# Patient Record
Sex: Female | Born: 1985 | Race: White | Hispanic: No | Marital: Single | Smoking: Never smoker
Health system: Southern US, Community
[De-identification: ages and names within clinical notes are randomized; demographics above are authoritative.]

---

## 2017-12-05 ENCOUNTER — Emergency Department (HOSPITAL_COMMUNITY): Payer: Self-pay

## 2017-12-05 ENCOUNTER — Emergency Department (HOSPITAL_COMMUNITY)
Admission: EM | Admit: 2017-12-05 | Discharge: 2017-12-05 | Disposition: A | Discharge status: Home / Self Care | Payer: Self-pay | Attending: Emergency Medicine | Admitting: Emergency Medicine

## 2017-12-05 ENCOUNTER — Encounter (HOSPITAL_COMMUNITY): Payer: Self-pay | Admitting: Emergency Medicine

## 2017-12-05 ENCOUNTER — Other Ambulatory Visit: Payer: Self-pay

## 2017-12-05 DIAGNOSIS — D509 Iron deficiency anemia, unspecified: Secondary | ICD-10-CM | POA: Insufficient documentation

## 2017-12-05 DIAGNOSIS — N10 Acute tubulo-interstitial nephritis: Secondary | ICD-10-CM | POA: Insufficient documentation

## 2017-12-05 DIAGNOSIS — N83202 Unspecified ovarian cyst, left side: Secondary | ICD-10-CM | POA: Insufficient documentation

## 2017-12-05 DIAGNOSIS — E876 Hypokalemia: Secondary | ICD-10-CM | POA: Insufficient documentation

## 2017-12-05 DIAGNOSIS — R1032 Left lower quadrant pain: Secondary | ICD-10-CM | POA: Insufficient documentation

## 2017-12-05 DIAGNOSIS — N12 Tubulo-interstitial nephritis, not specified as acute or chronic: Secondary | ICD-10-CM

## 2017-12-05 LAB — CBC WITH DIFFERENTIAL/PLATELET
BASOS ABS: 0 10*3/uL (ref 0.0–0.1)
Basophils Relative: 0 %
Eosinophils Absolute: 0 10*3/uL (ref 0.0–0.7)
Eosinophils Relative: 0 %
HEMATOCRIT: 31.1 % — AB (ref 36.0–46.0)
Hemoglobin: 8.9 g/dL — ABNORMAL LOW (ref 12.0–15.0)
LYMPHS ABS: 1.2 10*3/uL (ref 0.7–4.0)
Lymphocytes Relative: 11 %
MCH: 18.4 pg — ABNORMAL LOW (ref 26.0–34.0)
MCHC: 28.6 g/dL — ABNORMAL LOW (ref 30.0–36.0)
MCV: 64.1 fL — AB (ref 78.0–100.0)
MONO ABS: 0.6 10*3/uL (ref 0.1–1.0)
MONOS PCT: 5 %
NEUTROS ABS: 9.4 10*3/uL — AB (ref 1.7–7.7)
NEUTROS PCT: 84 %
PLATELETS: 385 10*3/uL (ref 150–400)
RBC: 4.85 MIL/uL (ref 3.87–5.11)
RDW: 18.4 % — AB (ref 11.5–15.5)
WBC: 11.2 10*3/uL — AB (ref 4.0–10.5)

## 2017-12-05 LAB — BASIC METABOLIC PANEL
ANION GAP: 14 (ref 5–15)
BUN: 11 mg/dL (ref 6–20)
CALCIUM: 9.9 mg/dL (ref 8.9–10.3)
CO2: 21 mmol/L — ABNORMAL LOW (ref 22–32)
CREATININE: 0.94 mg/dL (ref 0.44–1.00)
Chloride: 99 mmol/L — ABNORMAL LOW (ref 101–111)
GFR calc non Af Amer: >60 mL/min (ref >60)
Glucose, Bld: 129 mg/dL — ABNORMAL HIGH (ref 65–99)
Potassium: 3.2 mmol/L — ABNORMAL LOW (ref 3.5–5.1)
SODIUM: 134 mmol/L — AB (ref 135–145)

## 2017-12-05 LAB — URINALYSIS, ROUTINE W REFLEX MICROSCOPIC
Bilirubin Urine: NEGATIVE
GLUCOSE, UA: NEGATIVE mg/dL
Hgb urine dipstick: NEGATIVE
Ketones, ur: 20 mg/dL — AB
LEUKOCYTES UA: NEGATIVE
Nitrite: NEGATIVE
PROTEIN: NEGATIVE mg/dL
SPECIFIC GRAVITY, URINE: 1.013 (ref 1.005–1.030)
pH: 6 (ref 5.0–8.0)

## 2017-12-05 LAB — I-STAT BETA HCG BLOOD, ED (MC, WL, AP ONLY)

## 2017-12-05 MED ORDER — ONDANSETRON HCL 4 MG/2ML IJ SOLN
4.0000 mg | Freq: Once | INTRAMUSCULAR | Status: AC
Start: 2017-12-05 — End: 2017-12-05
  Administered 2017-12-05: 4 mg via INTRAVENOUS
  Filled 2017-12-05: qty 2

## 2017-12-05 MED ORDER — KETOROLAC TROMETHAMINE 30 MG/ML IJ SOLN
30.0000 mg | Freq: Once | INTRAMUSCULAR | Status: AC
  Administered 2017-12-05: 30 mg via INTRAVENOUS
  Filled 2017-12-05: qty 1

## 2017-12-05 MED ORDER — FERROUS SULFATE 325 (65 FE) MG PO TABS: 325.0000 mg | ORAL_TABLET | Freq: Two times a day (BID) | ORAL | 0 refills | Status: AC

## 2017-12-05 MED ORDER — OXYCODONE-ACETAMINOPHEN 5-325 MG PO TABS: 1.0000 | ORAL_TABLET | ORAL | 0 refills | Status: AC | PRN

## 2017-12-05 MED ORDER — SODIUM CHLORIDE 0.9 % IV BOLUS
1000.0000 mL | Freq: Once | INTRAVENOUS | Status: AC
  Administered 2017-12-05: 1000 mL via INTRAVENOUS

## 2017-12-05 MED ORDER — ONDANSETRON HCL 4 MG PO TABS: 4.0000 mg | ORAL_TABLET | Freq: Four times a day (QID) | ORAL | 0 refills | Status: AC | PRN

## 2017-12-05 MED ORDER — MORPHINE SULFATE (PF) 4 MG/ML IV SOLN
4.0000 mg | Freq: Once | INTRAVENOUS | Status: AC
  Administered 2017-12-05: 4 mg via INTRAVENOUS
  Filled 2017-12-05: qty 1

## 2017-12-05 MED ORDER — POTASSIUM CHLORIDE CRYS ER 20 MEQ PO TBCR
40.0000 meq | EXTENDED_RELEASE_TABLET | Freq: Once | ORAL | Status: AC
Start: 2017-12-05 — End: 2017-12-05
  Administered 2017-12-05: 40 meq via ORAL
  Filled 2017-12-05: qty 2

## 2017-12-05 NOTE — ED Provider Notes (Signed)
[facility]-EMERGENCY DEPT Provider Note   CSN: [id number] Arrival date & time: 12/05/17  0043     History   Chief Complaint Chief Complaint  Patient presents with  . Flank Pain    HPI Dana Roberson is a 32 y.o. female.  The history is provided by the patient.  She has no significant past history, and comes in complaining of left flank pain which started this morning and got worse this afternoon and evening.  Pain is crampy and she rates it at 10/10.  Pain does radiate to the left lower abdomen.  She is unable to find any comfortable position.  Nothing makes it better, nothing makes it worse.  There is associated nausea and vomiting.  She was not aware of any fever, but has had some chills and sweats.  She has had some urinary hesitancy, but no dysuria.  She does have a history of having had a UTI about 4 years ago.  She has not taken anything for pain.  No prior history of kidney stones.  History reviewed. No pertinent past medical history.  There are no active problems to display for this patient.   History reviewed. No pertinent surgical history.   OB History   None      Home Medications    Prior to Admission medications   Not on File    Family History History reviewed. No pertinent family history.  Social History Social History   Tobacco Use  . Smoking status: Never Smoker  . Smokeless tobacco: Never Used  Substance Use Topics  . Alcohol use: Yes  . Drug use: Never     Allergies   Patient has no known allergies.   Review of Systems Review of Systems  All other systems reviewed and are negative.    Physical Exam Updated Vital Signs BP (!) 145/79 (BP Location: Left Arm)   Pulse 83   Temp 97.9 F (36.6 C) (Oral)   Resp 18   Ht  (1.6 m)   Wt 65.8 kg (145 lb)   LMP 11/05/2017   SpO2 100%   BMI 25.69 kg/m   Physical Exam  Nursing note and vitals reviewed.  32 year old female, in obvious pain, but in no acute  distress. Vital signs are significant for elevated systolic blood pressure. Oxygen saturation is 100%, which is normal. Head is normocephalic and atraumatic. PERRLA, EOMI. Oropharynx is clear. Neck is nontender and supple without adenopathy or JVD. Back is nontender in the midline.  There is moderate left CVA tenderness. Lungs are clear without rales, wheezes, or rhonchi. Chest is nontender. Heart has regular rate and rhythm without murmur. Abdomen is soft, flat, nontender without masses or hepatosplenomegaly and peristalsis is hypoactive. Extremities have no cyanosis or edema, full range of motion is present. Skin is warm and dry without rash. Neurologic: Mental status is normal, cranial nerves are intact, there are no motor or sensory deficits.  ED Treatments / Results  Labs (all labs ordered are listed, but only abnormal results are displayed) Labs Reviewed  URINALYSIS, ROUTINE W REFLEX MICROSCOPIC - Abnormal; Notable for the following components:      Result Value   Ketones, ur 20 (*)    All other components within normal limits  BASIC METABOLIC PANEL - Abnormal; Notable for the following components:   Sodium 134 (*)    Potassium 3.2 (*)    Chloride 99 (*)    CO2 21 (*)    [id number], Bld 129 (*)  All other components within normal limits  CBC WITH DIFFERENTIAL/PLATELET - Abnormal; Notable for the following components:   WBC 11.2 (*)    Hemoglobin 8.9 (*)    HCT 31.1 (*)    MCV 64.1 (*)    MCH 18.4 (*)    MCHC 28.6 (*)    RDW 18.4 (*)    Neutro Abs 9.4 (*)    All other components within normal limits  POC URINE PREG, ED  I-STAT BETA HCG BLOOD, ED ([facility], [facility], [facility] ONLY)   Radiology Ct Renal Stone Study  Result Date: 12/05/2017 CLINICAL DATA:  Left-sided flank pain starting yesterday. Nausea and vomiting. EXAM: CT ABDOMEN AND PELVIS WITHOUT CONTRAST TECHNIQUE: Multidetector CT imaging of the abdomen and pelvis was performed following the standard protocol without IV contrast.  COMPARISON:  None. FINDINGS: Lower chest: Lung bases are clear. Hepatobiliary: Multiple low-attenuation lesions in the liver. Largest measures about 11 mm diameter. Lesions are too small to accurately characterize on noncontrast imaging but likely represent cysts. Gallbladder and bile ducts are unremarkable. Pancreas: Unremarkable. No pancreatic ductal dilatation or surrounding inflammatory changes. Spleen: Normal in size without focal abnormality. Adrenals/Urinary Tract: Adrenal glands are unremarkable. Kidneys are normal, without renal calculi, focal lesion, or hydronephrosis. Bladder is unremarkable. Stomach/Bowel: Stomach is within normal limits. Appendix is not identified. No evidence of bowel wall thickening, distention, or inflammatory changes. Vascular/Lymphatic: No significant vascular findings are present. No enlarged abdominal or pelvic lymph nodes. Reproductive: Uterus is anteverted without enlargement. Right ovary is not enlarged. Left ovary is enlarged, measuring 3.6 x 6.3 cm. No focal mass lesion identified. Small amount of free fluid in the pelvis is likely physiologic. Other: No free air in the abdomen. Abdominal wall musculature appears intact. Musculoskeletal: No acute or significant osseous findings. IMPRESSION: 1. No renal or ureteral stone or obstruction. 2. Enlarged left ovary. Ultrasound suggested to exclude ovarian mass or torsion. 3. Small amount of free fluid in the pelvis is likely physiologic. 4. Multiple small hepatic low-attenuation lesions. Too small to characterize but likely to be cyst. Electronically Signed   By: Burman Nieves M.D.   On: 12/05/2017 03:53   US Pelvic Complete W Transvaginal And Torsion R/o  Result Date: 12/05/2017 CLINICAL DATA:  Left lower quadrant pain. Enlarged left ovary on CT. EXAM: TRANSABDOMINAL AND TRANSVAGINAL ULTRASOUND OF PELVIS DOPPLER ULTRASOUND OF OVARIES TECHNIQUE: Both transabdominal and transvaginal ultrasound examinations of the pelvis were  performed. Transabdominal technique was performed for global imaging of the pelvis including uterus, ovaries, adnexal regions, and pelvic cul-de-sac. It was necessary to proceed with endovaginal exam following the transabdominal exam to visualize the ovaries and endometrium. Color and duplex Doppler ultrasound was utilized to evaluate blood flow to the ovaries. COMPARISON:  CT abdomen and pelvis 12/05/2017 FINDINGS: Uterus Measurements: 8.2 x 4.1 x 5.1 cm. Uterus is anteverted. No fibroids or other mass visualized. Nabothian cysts in the cervix. Endometrium Thickness: 12.4 mm.  No focal abnormality visualized. Right ovary Measurements: 3.4 x 2.5 x 3.3 cm. Normal appearance/no adnexal mass. Left ovary Measurements: 8.3 x 4.1 x 6 cm. Left ovary is enlarged but contains normal follicular changes. Probable dominant follicle versus hemorrhagic cyst demonstrated on the left ovary. Flow is demonstrated in the left ovary on color flow Doppler imaging. Pulsed Doppler evaluation of both ovaries demonstrates normal arterial and venous waveforms on both ovaries. Other findings Small to moderate free fluid in the pelvis, likely physiologic. IMPRESSION: 1. Uterus and endometrium unremarkable. 2. Right ovary appears normal. 3. Left  ovary is enlarged but appears morphologically normal. Probable hemorrhagic cyst. Color flow as well as arterial and venous spectral flow velocity waveforms obtained suggest no evidence of torsion. 4. Free fluid in the pelvis is likely physiologic. Electronically Signed   By: Burman Nieves M.D.   On: 12/05/2017 06:58    Procedures Procedures   Medications Ordered in ED Medications  morphine 4 MG/ML injection 4 mg (has no administration in time range)  potassium chloride SA (K-DUR,KLOR-CON) CR tablet 40 mEq (has no administration in time range)  sodium chloride 0.9 % bolus 1,000 mL (0 mLs Intravenous Stopped 12/05/17 0542)  ketorolac (TORADOL) 30 MG/ML injection 30 mg (30 mg Intravenous Given  12/05/17 0302)  morphine 4 MG/ML injection 4 mg (4 mg Intravenous Given 12/05/17 0302)  ondansetron (ZOFRAN) injection 4 mg (4 mg Intravenous Given 12/05/17 0300)     Initial Impression / Assessment and Plan / ED Course  I have reviewed the triage vital signs and the nursing notes.  Pertinent labs & imaging results that were available during my care of the patient were reviewed by me and considered in my medical decision making (see chart for details).  Left flank pain-likely urolithiasis.  Consider pyelonephritis, diverticulitis.  Old records are reviewed, confirming prior ED visit in 2015 for urinary tract infection.  Urinalysis today is significant only for ketonuria, consistent with a history of recent vomiting.  Will send for renal stone protocol CT. we will start IV fluids and give ketorolac, ondansetron, morphine.  5:24 AM Pain improved with above-noted treatment.  CT shows no evidence of urolithiasis or hydronephrosis.  Enlarged left ovary found, will send for ultrasound to rule out torsion.  Laboratory work-up does show significant anemia with microcytic indices-probable iron deficiency.  7:53 AM Ultrasound shows no evidence of ovarian torsion.  Ovarian cyst-probably hemorrhagic.  This is likely the source of her pain.  She is given a dose of oral potassium because of mild hypokalemia, presumably secondary to vomiting.  She is discharged with prescriptions for ondansetron, oxycodone-acetaminophen, and ferrous sulfate.  Advised to follow-up with women's clinic to ensure resolution of ovarian cyst.  Final Clinical Impressions(s) / ED Diagnoses   Final diagnoses:  LLQ pain  Ovarian cyst, left  Microcytic anemia  Hypokalemia  Pyelonephritis    ED Discharge Orders        Ordered    ondansetron (ZOFRAN) 4 MG tablet  Every 6 hours PRN     12/05/17 0749    ferrous sulfate 325 (65 FE) MG tablet  2 times daily with meals     12/05/17 0749    oxyCODONE-acetaminophen (PERCOCET) 5-325 MG  tablet  Every 4 hours PRN     12/05/17 0749       Dione Booze, MD 12/05/17 [id number]

## 2017-12-05 NOTE — ED Triage Notes (Signed)
Pt reports having left sided flank pain that started yesterday and has progressed over the last few hours.

## 2017-12-05 NOTE — Discharge Instructions (Signed)
You may take ibuprofen or naproxen as needed for pain control, or to supplement oxycodone-acetaminophen for pain control.  You should follow-up with the women's clinic to have a follow-up ultrasound to make sure your cyst has resolved.  Your hemoglobin is low today.  This is probably from excessive blood loss through your periods.  You have been given a prescription for iron to try to build your hemoglobin back up.  Return if pain is not being adequately controlled.

## 2019-09-05 IMAGING — CT CT RENAL STONE PROTOCOL
2 of 4 series · 16 of 46 positions shown, 18 images · non-contrast
Comparison: None.

CLINICAL DATA: Left-sided flank pain starting yesterday. Nausea and
vomiting.

EXAM:
CT ABDOMEN AND PELVIS WITHOUT CONTRAST
TECHNIQUE: Multidetector CT imaging of the abdomen and pelvis was performed
following the standard protocol without IV contrast.

[Series 2: axial st · axial · 0.70mm/px · z∈[-439,-84]mm · 13 of 81 slices shown, 15 images]
[im 5/81  soft-tissue]
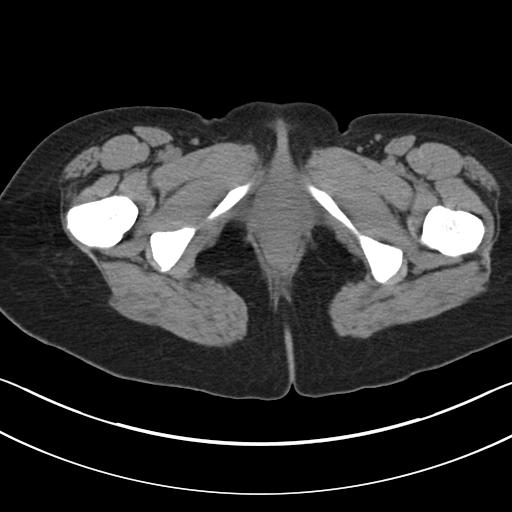
[im 5/81  bone]
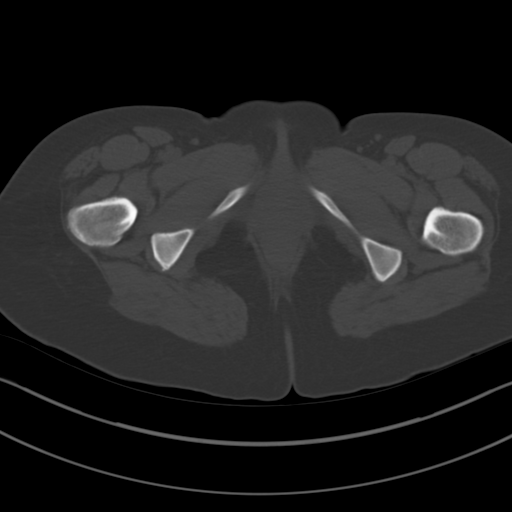
[im 9/81  soft-tissue]
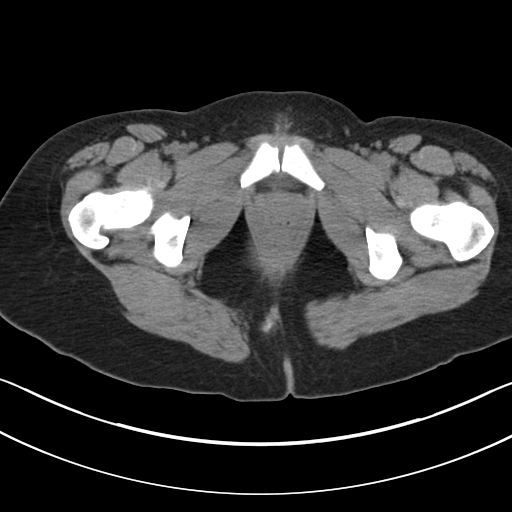
[im 18/81  soft-tissue]
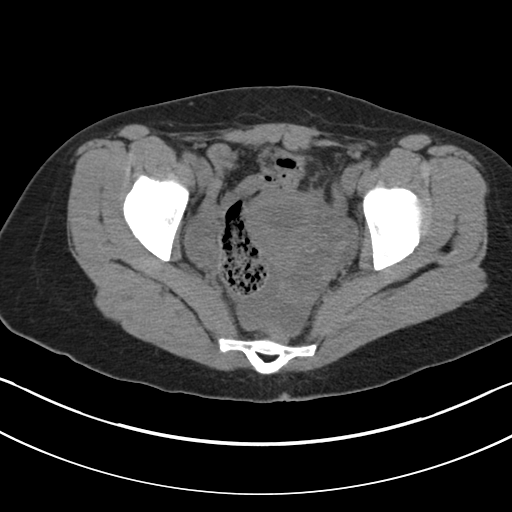
[im 23/81  soft-tissue]
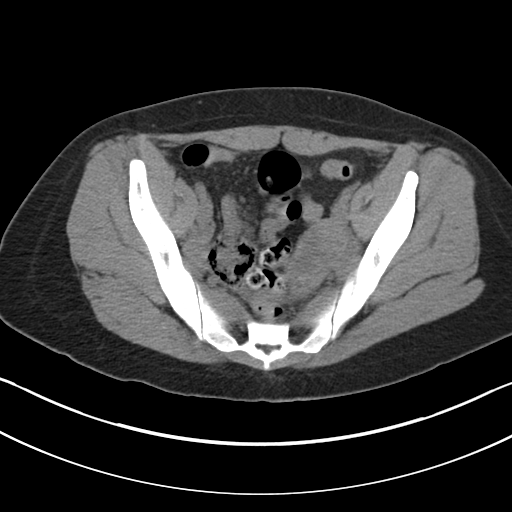
[im 27/81  soft-tissue]
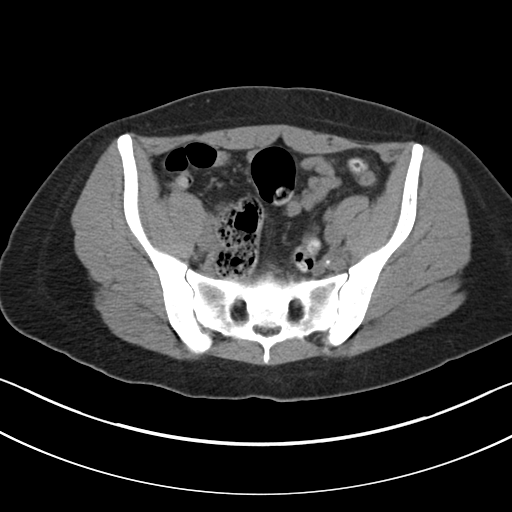
[im 36/81  soft-tissue]
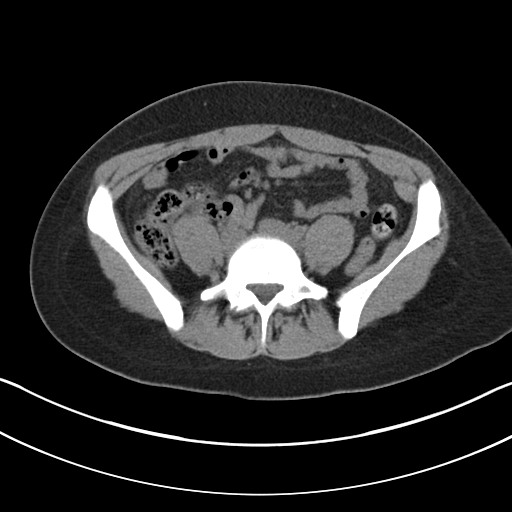
[im 41/81  soft-tissue]
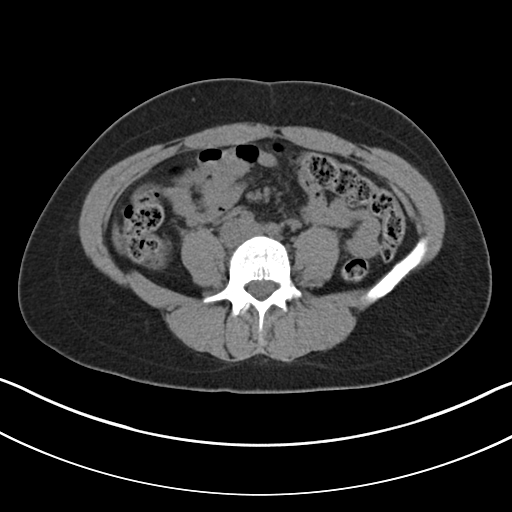
[im 45/81  soft-tissue]
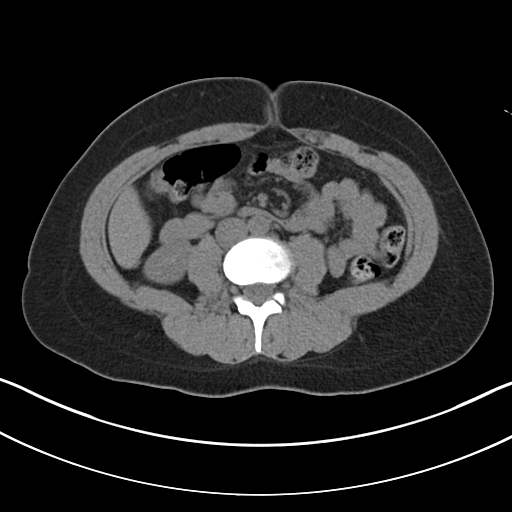
[im 54/81  soft-tissue]
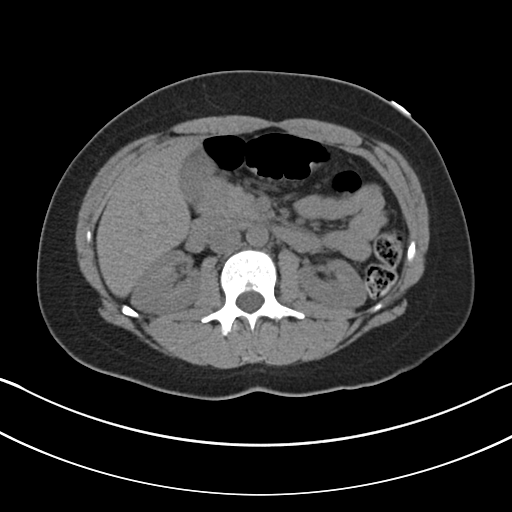
[im 54/81  bone]
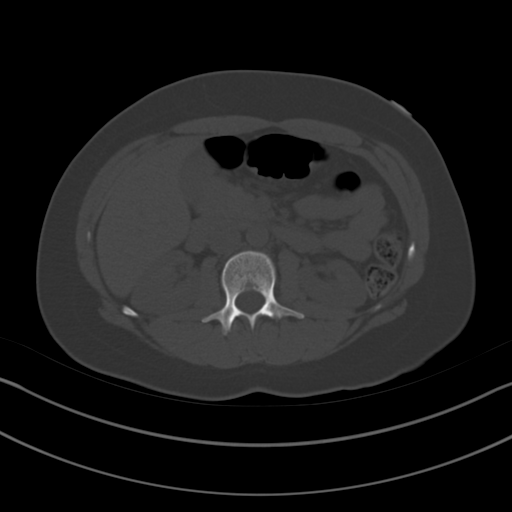
[im 58/81  soft-tissue]
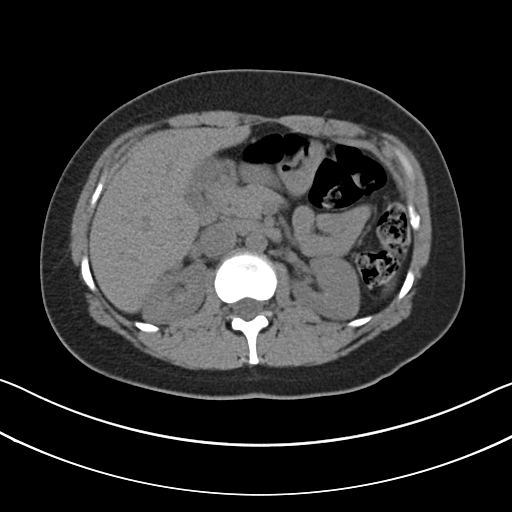
[im 63/81  soft-tissue]
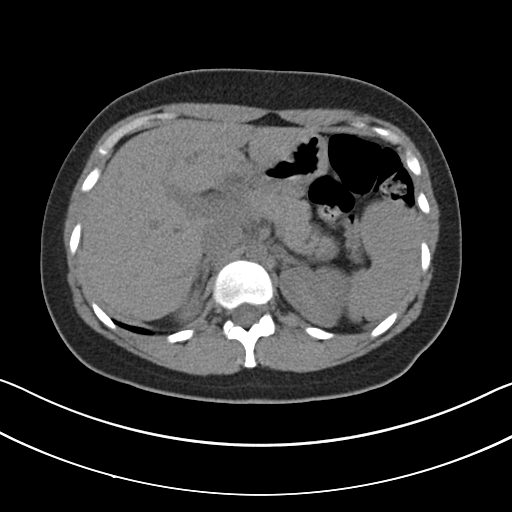
[im 72/81  soft-tissue]
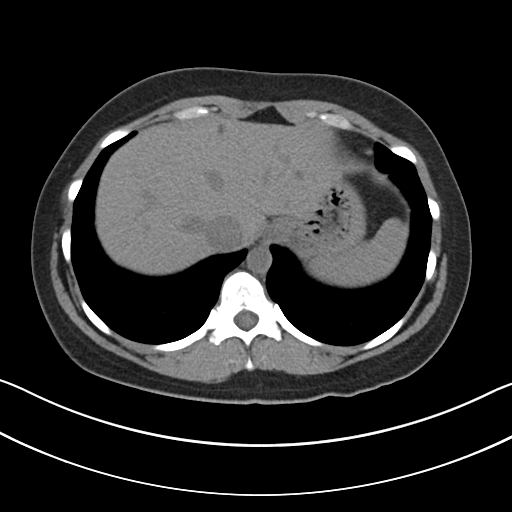
[im 76/81  soft-tissue]
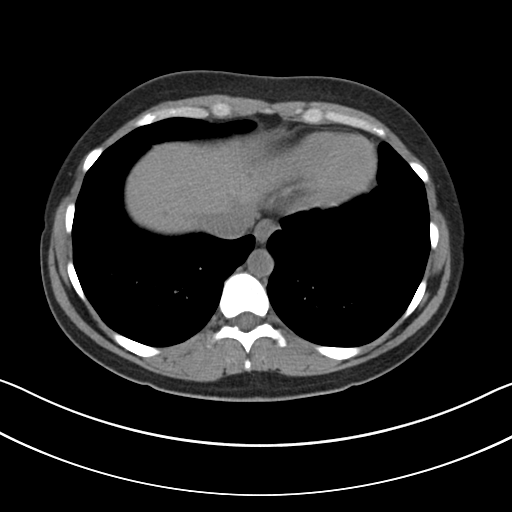

[Series 5: coronal · coronal · 0.72mm/px · 3 of 115 slices shown]
[im 39/115  soft-tissue]
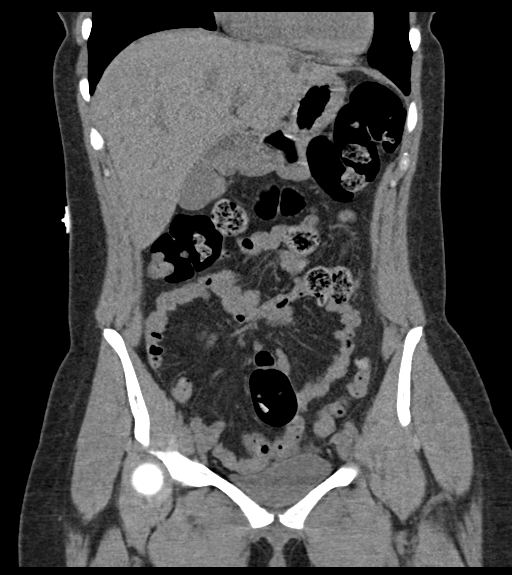
[im 51/115  soft-tissue]
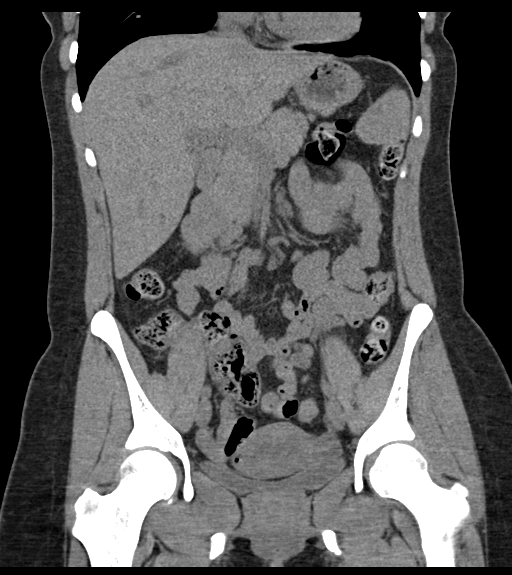
[im 64/115  soft-tissue]
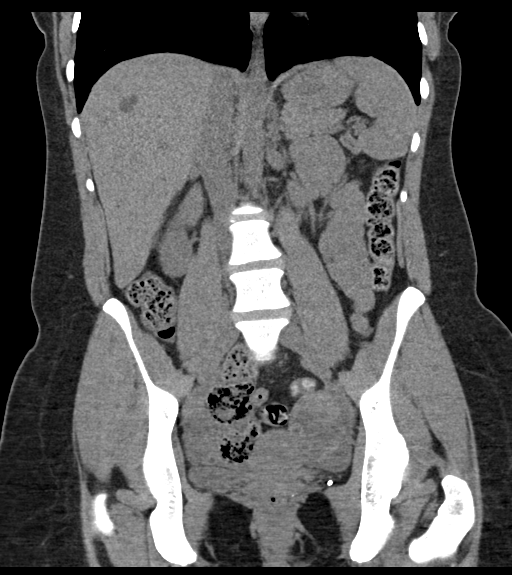

[16 of 46 positions shown; findings below may reference images not displayed]

FINDINGS: Lower chest: Lung bases are clear.

Hepatobiliary: Multiple low-attenuation lesions in the liver.
Largest measures about 11 mm diameter. Lesions are too small to
accurately characterize on noncontrast imaging but likely represent
cysts. Gallbladder and bile ducts are unremarkable.

Pancreas: Unremarkable. No pancreatic ductal dilatation or
surrounding inflammatory changes.

Spleen: Normal in size without focal abnormality.

Adrenals/Urinary Tract: Adrenal glands are unremarkable. Kidneys are
normal, without renal calculi, focal lesion, or hydronephrosis.
Bladder is unremarkable.

Stomach/Bowel: Stomach is within normal limits. Appendix is not
identified. No evidence of bowel wall thickening, distention, or
inflammatory changes.

Vascular/Lymphatic: No significant vascular findings are present. No
enlarged abdominal or pelvic lymph nodes.

Reproductive: Uterus is anteverted without enlargement. Right ovary
is not enlarged. Left ovary is enlarged, measuring 3.6 x 6.3 cm. No
focal mass lesion identified. Small amount of free fluid in the
pelvis is likely physiologic.

Other: No free air in the abdomen. Abdominal wall musculature
appears intact.

Musculoskeletal: No acute or significant osseous findings.
IMPRESSION: 1. No renal or ureteral stone or obstruction.
2. Enlarged left ovary. Ultrasound suggested to exclude ovarian mass
or torsion.
3. Small amount of free fluid in the pelvis is likely physiologic.
4. Multiple small hepatic low-attenuation lesions. Too small to
characterize but likely to be cyst.
